# Patient Record
Sex: Female | Born: 1995 | Race: Black or African American | Hispanic: No | Marital: Single | Smoking: Never smoker
Health system: Southern US, Community
[De-identification: ages and names within clinical notes are randomized; demographics above are authoritative.]

---

## 2016-06-01 ENCOUNTER — Encounter (HOSPITAL_COMMUNITY): Payer: Self-pay | Admitting: *Deleted

## 2016-06-01 ENCOUNTER — Ambulatory Visit (HOSPITAL_COMMUNITY)
Admission: EM | Admit: 2016-06-01 | Discharge: 2016-06-01 | Disposition: A | Discharge status: Home / Self Care | Payer: BLUE CROSS/BLUE SHIELD | Attending: Family Medicine | Admitting: Family Medicine

## 2016-06-01 DIAGNOSIS — Z3201 Encounter for pregnancy test, result positive: Secondary | ICD-10-CM | POA: Diagnosis not present

## 2016-06-01 LAB — POCT PREGNANCY, URINE: Preg Test, Ur: POSITIVE — AB

## 2016-06-01 NOTE — ED Triage Notes (Signed)
Pt  Is  Late  On  Her  Period  She   States  Had  Positive  Pregnancy  Test     She  denys  Any  Symptoms

## 2016-06-01 NOTE — ED Provider Notes (Signed)
CSN: [id number]     Arrival date & time 06/01/16  1343 History   None    Chief Complaint  Patient presents with  . Possible Pregnancy   (Consider location/radiation/quality/duration/timing/severity/associated sxs/prior Treatment) HPI   20 y.o. Female G0P0 presents to UC c/o missed period which was to occur 5 days ago and 2 positive home pregnancy tests.  Reports intercourse at the beginning of this month. No form of bc. Reports slight nausea last week. Denies fever, abdominal pain, dysuria.  Reports good family support. Has never seen an OBGYN.   History reviewed. No pertinent past medical history. History reviewed. No pertinent surgical history. History reviewed. No pertinent family history. Social History  Substance Use Topics  . Smoking status: Never Smoker  . Smokeless tobacco: Never Used  . Alcohol use No   OB History    No data available     Review of Systems  Denies: HEADACHE, ABDOMINAL PAIN, CHEST PAIN, CONGESTION, DYSURIA, SHORTNESS OF BREATH   Allergies  Review of patient's allergies indicates no known allergies.  Home Medications   Prior to Admission medications   Not on File   Meds Ordered and Administered this Visit  Medications - No data to display  BP (!) 88/61 (BP Location: Right Arm)   Pulse 65   Temp 98.7 F (37.1 C) (Oral)   Resp 16   LMP 05/05/2016   SpO2 100%  No data found.   Physical Exam  Constitutional: She is oriented to person, place, and time. She appears well-developed and well-nourished.  HENT:  Head: Normocephalic and atraumatic.  Eyes: EOM are normal.  Neck: Normal range of motion.  Cardiovascular: Normal rate.   Pulmonary/Chest: Effort normal. No respiratory distress.  Abdominal: Soft.  Musculoskeletal: Normal range of motion.  Neurological: She is alert and oriented to person, place, and time.  Skin: Skin is warm and dry.  Psychiatric: She has a normal mood and affect. Her behavior is normal.  Nursing note and vitals  reviewed.   Urgent Care Course   Clinical Course    Procedures (including critical care time)  Labs Review Labs Reviewed  POCT PREGNANCY, URINE - Abnormal; Notable for the following:       Result Value   Preg Test, Ur POSITIVE (*)    All other components within normal limits    Imaging Review No results found.    MDM   1. Positive pregnancy test    20 y.o. Presents with c/o missed period and two at home positive pregnancy tests.   POC pregnancy urine: positive  Patient notified of results and referred to Dr. Mindi SlickerBanga for prenatal care. Advised to call office to make an appointment as soon as possible.  Advised to begin taking 0.4 mg folic acid daily, avoid all alcohol and tobacco products.  Discussed handout on prenatal care.     Tharon AquasFrank C Dannisha Eckmann, PA 06/01/16 1958

## 2016-06-10 ENCOUNTER — Emergency Department (HOSPITAL_COMMUNITY): Payer: BLUE CROSS/BLUE SHIELD

## 2016-06-10 ENCOUNTER — Encounter (HOSPITAL_COMMUNITY): Payer: Self-pay | Admitting: Emergency Medicine

## 2016-06-10 ENCOUNTER — Emergency Department (HOSPITAL_COMMUNITY)
Admission: EM | Admit: 2016-06-10 | Discharge: 2016-06-10 | Disposition: A | Discharge status: Home / Self Care | Payer: BLUE CROSS/BLUE SHIELD | Attending: Emergency Medicine | Admitting: Emergency Medicine

## 2016-06-10 DIAGNOSIS — O26891 Other specified pregnancy related conditions, first trimester: Secondary | ICD-10-CM | POA: Diagnosis present

## 2016-06-10 DIAGNOSIS — Z3A01 Less than 8 weeks gestation of pregnancy: Secondary | ICD-10-CM | POA: Insufficient documentation

## 2016-06-10 DIAGNOSIS — N898 Other specified noninflammatory disorders of vagina: Secondary | ICD-10-CM | POA: Insufficient documentation

## 2016-06-10 DIAGNOSIS — O99011 Anemia complicating pregnancy, first trimester: Secondary | ICD-10-CM | POA: Diagnosis not present

## 2016-06-10 DIAGNOSIS — D509 Iron deficiency anemia, unspecified: Secondary | ICD-10-CM

## 2016-06-10 DIAGNOSIS — O9989 Other specified diseases and conditions complicating pregnancy, childbirth and the puerperium: Secondary | ICD-10-CM | POA: Diagnosis not present

## 2016-06-10 DIAGNOSIS — R102 Pelvic and perineal pain: Secondary | ICD-10-CM

## 2016-06-10 LAB — COMPREHENSIVE METABOLIC PANEL
ALT: 9 U/L — ABNORMAL LOW (ref 14–54)
AST: 16 U/L (ref 15–41)
Albumin: 4.3 g/dL (ref 3.5–5.0)
Alkaline Phosphatase: 47 U/L (ref 38–126)
Anion gap: 7 (ref 5–15)
BUN: 9 mg/dL (ref 6–20)
CHLORIDE: 103 mmol/L (ref 101–111)
CO2: 24 mmol/L (ref 22–32)
Calcium: 9.4 mg/dL (ref 8.9–10.3)
Creatinine, Ser: 0.71 mg/dL (ref 0.44–1.00)
Glucose, Bld: 82 mg/dL (ref 65–99)
POTASSIUM: 3.6 mmol/L (ref 3.5–5.1)
Sodium: 134 mmol/L — ABNORMAL LOW (ref 135–145)
Total Bilirubin: 0.8 mg/dL (ref 0.3–1.2)
Total Protein: 7.3 g/dL (ref 6.5–8.1)

## 2016-06-10 LAB — URINALYSIS, ROUTINE W REFLEX MICROSCOPIC
Bilirubin Urine: NEGATIVE
GLUCOSE, UA: NEGATIVE mg/dL
Hgb urine dipstick: NEGATIVE
Ketones, ur: NEGATIVE mg/dL
LEUKOCYTES UA: NEGATIVE
NITRITE: NEGATIVE
PH: 7.5 (ref 5.0–8.0)
PROTEIN: NEGATIVE mg/dL
Specific Gravity, Urine: 1.012 (ref 1.005–1.030)

## 2016-06-10 LAB — WET PREP, GENITAL
Sperm: NONE SEEN
TRICH WET PREP: NONE SEEN
YEAST WET PREP: NONE SEEN

## 2016-06-10 LAB — CBC
HEMATOCRIT: 32.5 % — AB (ref 36.0–46.0)
Hemoglobin: 10.7 g/dL — ABNORMAL LOW (ref 12.0–15.0)
MCH: 25.1 pg — ABNORMAL LOW (ref 26.0–34.0)
MCHC: 32.9 g/dL (ref 30.0–36.0)
MCV: 76.1 fL — AB (ref 78.0–100.0)
PLATELETS: 182 10*3/uL (ref 150–400)
RBC: 4.27 MIL/uL (ref 3.87–5.11)
RDW: 17.8 % — ABNORMAL HIGH (ref 11.5–15.5)
WBC: 5.2 10*3/uL (ref 4.0–10.5)

## 2016-06-10 LAB — HCG, QUANTITATIVE, PREGNANCY: hCG, Beta Chain, Quant, S: 55334 m[IU]/mL — ABNORMAL HIGH (ref <5)

## 2016-06-10 LAB — LIPASE, BLOOD: LIPASE: 21 U/L (ref 11–51)

## 2016-06-10 MED ORDER — PRENATAL COMPLETE 14-0.4 MG PO TABS: 1.0000 | ORAL_TABLET | Freq: Every day | ORAL | 0 refills | Status: DC

## 2016-06-10 NOTE — Progress Notes (Signed)
Patient listed as having [employer] insurance without a pcp.  EDCM spoke to patient at bedside.  Patient confirms she does  Not have a pcp.  EDCM provided patient with a list of providers who accept [employer] insurance.  Encouraged patient to establish care as soon as possible.  No further EDCM needs at this time.

## 2016-06-10 NOTE — ED Triage Notes (Signed)
Patient complaining of lower abdominal pain x1 week. Denies nausea, vomiting, diarrhea, dysuria. Reports yellow/white vaginal discharge x1-2 days. Also reports intermittent headaches x1 week. Patient alert, oriented x4, ambulatory.

## 2016-06-10 NOTE — ED Provider Notes (Signed)
[facility]-EMERGENCY DEPT Provider Note   CSN: [id number] Arrival date & time: 06/10/16  1746     History   Chief Complaint Chief Complaint  Patient presents with  . Abdominal Pain  . Vaginal Discharge  . Headache    HPI Molly Rowe is a 20 y.o. female.  She complains of right suprapubic pain which started about one week ago. Pain waxes and wanes. She rates it at 6/10. It is usually a dull pressure, with occasional sharp pains. It is worse when she walks but notices no other aggravating or alleviating factors. She has not taken anything for pain. There is no dyspareunia. She denies urinary urgency, frequency, tenesmus, dysuria. She did have a mild vaginal discharge 2 days ago but that has resolved. She did have some vaginal bleeding following sexual intercourse several days ago but no ongoing bleeding. Last menses was sometime in September and she states she has irregular menses. She did go to urgent care about one week ago and did have a positive pregnancy test. She has not started prenatal care. She is gravida 1, paraso. During the same time frame, she has had an intermittent right frontal headache. Pain is sharp and will last for about 10 minutes before resolving. She is unable to put a number on the pain when present. She has noted no aggravating or alleviating factors. She denies nausea or vomiting and denies breast swelling or tenderness. Of note, she was advised to take folic acid when she was seen at urgent care but has not been doing so. She was also advised to make arrangements for prenatal care but has not done so.   The history is provided by the patient.  Abdominal Pain   Associated symptoms include headaches.  Vaginal Discharge   Associated symptoms include abdominal pain.  Headache      History reviewed. No pertinent past medical history.  There are no active problems to display for this patient.   History reviewed. No pertinent surgical history.  OB History    No  data available       Home Medications    Prior to Admission medications   Not on File    Family History No family history on file.  Social History Social History  Substance Use Topics  . Smoking status: Never Smoker  . Smokeless tobacco: Never Used  . Alcohol use No     Allergies   Review of patient's allergies indicates no known allergies.   Review of Systems Review of Systems  Gastrointestinal: Positive for abdominal pain.  Genitourinary: Positive for vaginal discharge.  Neurological: Positive for headaches.  All other systems reviewed and are negative.    Physical Exam Updated Vital Signs BP 129/65 (BP Location: Right Arm)   Pulse 90   Temp 98.6 F (37 C) (Oral)   Resp 16   Ht 5\' 1"  (1.549 m)   Wt 125 lb (56.7 kg)   LMP 05/05/2016   SpO2 100%   BMI 23.62 kg/m   Physical Exam  Nursing note and vitals reviewed.  20 year old female, resting comfortably and in no acute distress. Vital signs are Normal. Oxygen saturation is 100%, which is normal. Head is normocephalic and atraumatic. PERRLA, EOMI. Oropharynx is clear. There is mild tenderness of the right temporalis muscle which does reproduce her pain. There is no tenderness of the paracervical muscles. Neck is nontender and supple without adenopathy or JVD. Back is nontender and there is no CVA tenderness. Lungs are clear without rales,  wheezes, or rhonchi. Chest is nontender. Heart has regular rate and rhythm without murmur. Abdomen is soft, flat, with mild right suprapubic tenderness. There is no rebound or guarding. There are no masses or hepatosplenomegaly and peristalsis is normoactive. Pelvic: Normal external female genitalia. Cervix is closed. No vaginal discharge seen, no bleeding seen. Mild cervical friability is present. Fundus is retroverted and difficult to assess size. No cervical motion tenderness. No adnexal masses or tenderness. Extremities have no cyanosis or edema, full range of motion  is present. Skin is warm and dry without rash. Neurologic: Mental status is normal, cranial nerves are intact, there are no motor or sensory deficits.  ED Treatments / Results  Labs (all labs ordered are listed, but only abnormal results are displayed) Labs Reviewed  WET PREP, GENITAL - Abnormal; Notable for the following:       Result Value   Clue Cells Wet Prep HPF POC PRESENT (*)    WBC, Wet Prep HPF POC FEW (*)    All other components within normal limits  COMPREHENSIVE METABOLIC PANEL - Abnormal; Notable for the following:    Sodium 134 (*)    ALT 9 (*)    All other components within normal limits  CBC - Abnormal; Notable for the following:    Hemoglobin 10.7 (*)    HCT 32.5 (*)    MCV 76.1 (*)    MCH 25.1 (*)    RDW 17.8 (*)    All other components within normal limits  URINALYSIS, ROUTINE W REFLEX MICROSCOPIC (NOT AT [facility]) - Abnormal; Notable for the following:    APPearance CLOUDY (*)    All other components within normal limits  HCG, QUANTITATIVE, PREGNANCY - Abnormal; Notable for the following:    hCG, Beta Chain, Quant, S 55,334 (*)    All other components within normal limits  LIPASE, BLOOD  RPR  HIV ANTIBODY (ROUTINE TESTING)  GC/CHLAMYDIA PROBE AMP ([place]) NOT AT [facility]   Radiology US Ob Comp Less 14 Wks  Result Date: 06/10/2016 CLINICAL DATA:  Lower abdominal pain for a few days. Bleeding last week. EXAM: OBSTETRIC <14 WK [place] AND TRANSVAGINAL OB US TECHNIQUE: Both transabdominal and transvaginal ultrasound examinations were performed for complete evaluation of the gestation as well as the maternal uterus, adnexal regions, and pelvic cul-de-sac. Transvaginal technique was performed to assess early pregnancy. COMPARISON:  None. FINDINGS: Intrauterine gestational sac: Visible Yolk sac:  Visible Embryo:  Not visible Cardiac Activity: Not visible Heart Rate:   bpm MSD: 17  mm   6 w   4  d CRL:    mm    w    d                  [place] EDC: Subchorionic hemorrhage:  None  visualized. Maternal uterus/adnexae: Normal. No abnormal pelvic fluid collections. IMPRESSION: The gestational sac is irregular in morphology, and the endometrial decidual reaction is relatively weak. Findings are suspicious but not yet definitive for failed pregnancy. Recommend follow-up US in 10-14 days for definitive diagnosis. This recommendation follows SRU consensus guidelines: Diagnostic Criteria for Nonviable Pregnancy Early in the First Trimester. Malva Limes Med 2013; 130:8657-84. Electronically Signed   By: Ellery Plunk M.D.   On: 06/10/2016 22:36   US Ob Transvaginal  Result Date: 06/10/2016 CLINICAL DATA:  Lower abdominal pain for a few days. Bleeding last week. EXAM: OBSTETRIC <14 WK [place] AND TRANSVAGINAL OB US TECHNIQUE: Both transabdominal and transvaginal ultrasound examinations were performed for  complete evaluation of the gestation as well as the maternal uterus, adnexal regions, and pelvic cul-de-sac. Transvaginal technique was performed to assess early pregnancy. COMPARISON:  None. FINDINGS: Intrauterine gestational sac: Visible Yolk sac:  Visible Embryo:  Not visible Cardiac Activity: Not visible Heart Rate:   bpm MSD: 17  mm   6 w   4  d CRL:    mm    w    d                  US EDC: Subchorionic hemorrhage:  None visualized. Maternal uterus/adnexae: Normal. No abnormal pelvic fluid collections. IMPRESSION: The gestational sac is irregular in morphology, and the endometrial decidual reaction is relatively weak. Findings are suspicious but not yet definitive for failed pregnancy. Recommend follow-up US in 10-14 days for definitive diagnosis. This recommendation follows SRU consensus guidelines: Diagnostic Criteria for Nonviable Pregnancy Early in the First Trimester. Malva Limes Engl J Med 2013; 829:5621-30; 369:1443-51. Electronically Signed   By: Ellery Plunkaniel R Mitchell M.D.   On: 06/10/2016 22:36    Procedures Procedures (including critical care time)  Medications Ordered in ED Medications - No data to  display   Initial Impression / Assessment and Plan / ED Course  I have reviewed the triage vital signs and the nursing notes.  Pertinent labs & imaging results that were available during my care of the patient were reviewed by me and considered in my medical decision making (see chart for details).  Clinical Course   Pelvic pain in patient in first trimester pregnancy with one episode of vaginal bleeding. This certainly could be related to a corpus luteum cyst, possible round ligament pain. Because of vaginal bleeding, she will be sent for pelvic ultrasound. Old records are reviewed confirming urgent care visit with positive pregnancy test on October 23.  HCG is come back over 55,000. Wet prep did show clue cells, which he does not clinically have bacterial vaginosis, so no treatment is initiated. Ultrasound is worrisome for failed pregnancy. Everything this to the patient. She is referred to women's clinic for follow-up.  Final Clinical Impressions(s) / ED Diagnoses   Final diagnoses:  Pelvic pain during pregnancy in first trimester, antepartum  Microcytic anemia    New Prescriptions Discharge Medication List as of 06/10/2016 11:17 PM    START taking these medications   Details  Prenatal Vit-Fe Fumarate-FA (PRENATAL COMPLETE) 14-0.4 MG TABS Take 1 tablet by mouth daily., Starting Wed 06/10/2016, Print         Dione Boozeavid Briannah Lona, MD 06/11/16 [phone]

## 2016-06-11 ENCOUNTER — Telehealth: Payer: Self-pay | Admitting: *Deleted

## 2016-06-11 DIAGNOSIS — Z3490 Encounter for supervision of normal pregnancy, unspecified, unspecified trimester: Secondary | ICD-10-CM

## 2016-06-11 LAB — GC/CHLAMYDIA PROBE AMP (~~LOC~~) NOT AT ARMC
Chlamydia: NEGATIVE
NEISSERIA GONORRHEA: NEGATIVE

## 2016-06-11 LAB — RPR: RPR Ser Ql: NONREACTIVE

## 2016-06-11 LAB — HIV ANTIBODY (ROUTINE TESTING W REFLEX): HIV Screen 4th Generation wRfx: NONREACTIVE

## 2016-06-11 NOTE — Telephone Encounter (Signed)
Repeat US scheduled for patient.

## 2016-06-30 ENCOUNTER — Encounter: Payer: Self-pay | Admitting: Family Medicine

## 2016-06-30 ENCOUNTER — Ambulatory Visit: Payer: BLUE CROSS/BLUE SHIELD | Admitting: *Deleted

## 2016-06-30 ENCOUNTER — Ambulatory Visit (HOSPITAL_COMMUNITY)
Admission: RE | Admit: 2016-06-30 | Discharge: 2016-06-30 | Disposition: A | Discharge status: Home / Self Care | Payer: BLUE CROSS/BLUE SHIELD | Source: Ambulatory Visit | Attending: Obstetrics & Gynecology | Admitting: Obstetrics & Gynecology

## 2016-06-30 DIAGNOSIS — Z3689 Encounter for other specified antenatal screening: Secondary | ICD-10-CM | POA: Insufficient documentation

## 2016-06-30 DIAGNOSIS — Z3490 Encounter for supervision of normal pregnancy, unspecified, unspecified trimester: Secondary | ICD-10-CM

## 2016-06-30 DIAGNOSIS — O208 Other hemorrhage in early pregnancy: Secondary | ICD-10-CM | POA: Diagnosis not present

## 2016-06-30 DIAGNOSIS — Z3A08 8 weeks gestation of pregnancy: Secondary | ICD-10-CM | POA: Diagnosis not present

## 2016-06-30 NOTE — Progress Notes (Signed)
EDD  02/08/17 per US today.  Pt informed of viable pregnancy and need to begin prenatal care.  She voiced understanding.

## 2017-10-09 IMAGING — US US OB TRANSVAGINAL
1 series · 15 of 28 positions shown · non-contrast
Comparison: None.

CLINICAL DATA: Early pregnancy

EXAM:
TRANSVAGINAL OB ULTRASOUND
TECHNIQUE: Transvaginal ultrasound was performed for complete evaluation of the
gestation as well as the maternal uterus, adnexal regions, and
pelvic cul-de-sac.

[Series 1: us ob transvaginal · 39 acquisitions, 15 frames shown]
[im 1/39]
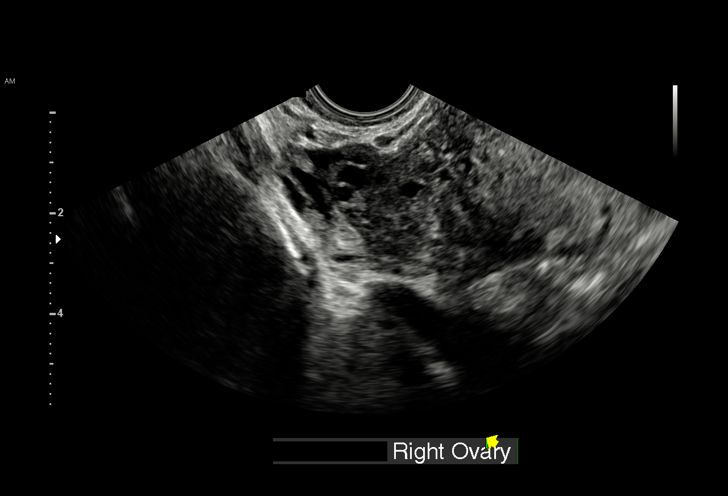
[im 3/39]
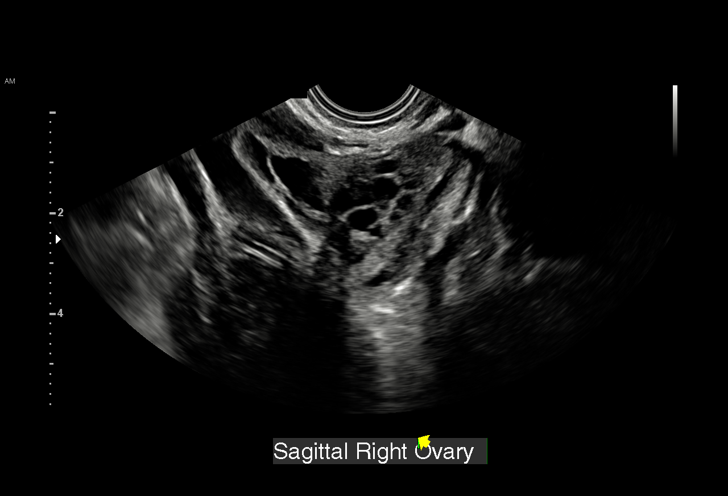
[im 6/39]
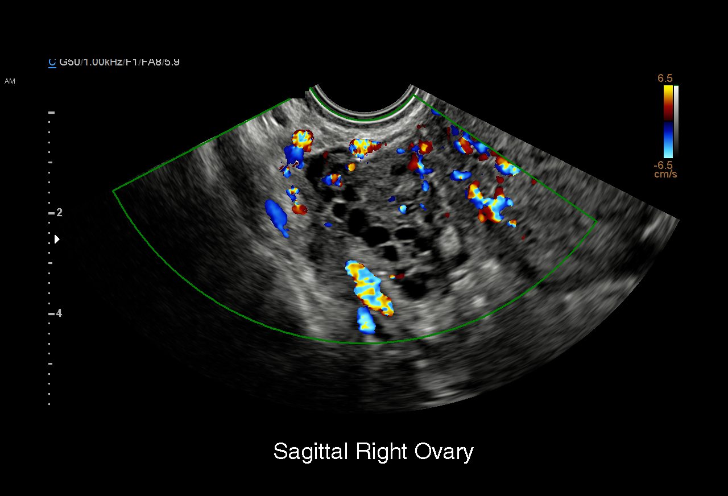
[im 9/39]
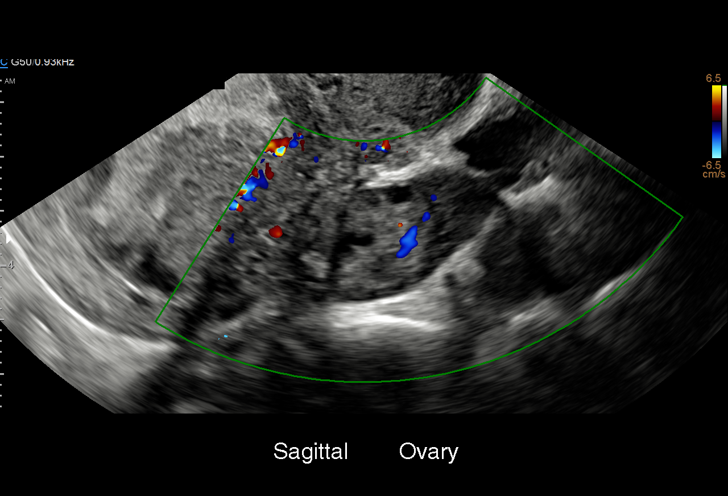
[im 12/39]
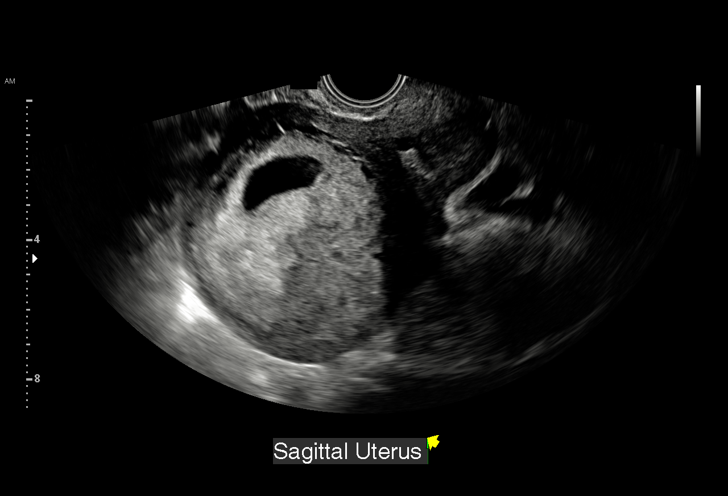
[im 15/39]
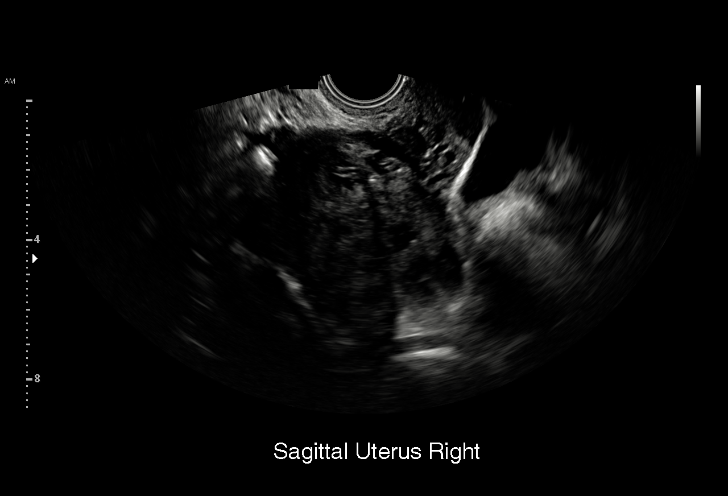
[im 17/39]
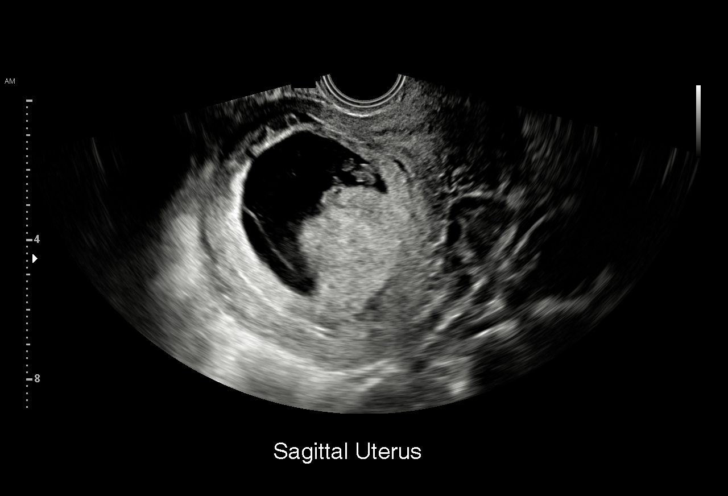
[im 20/39]
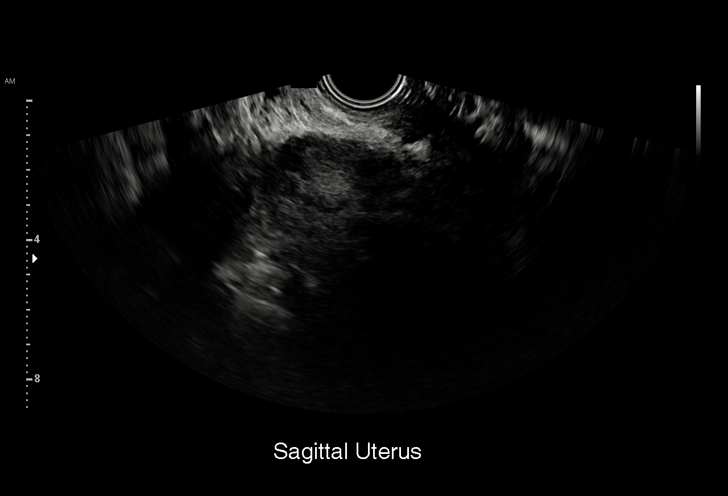
[im 22/39]
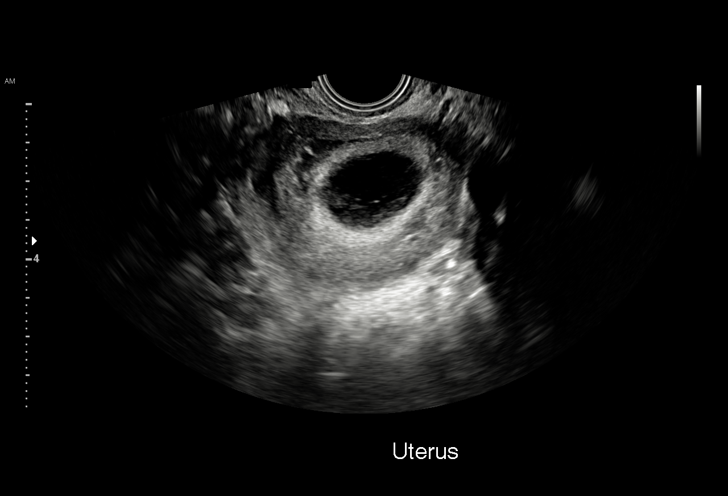
[im 24/39]
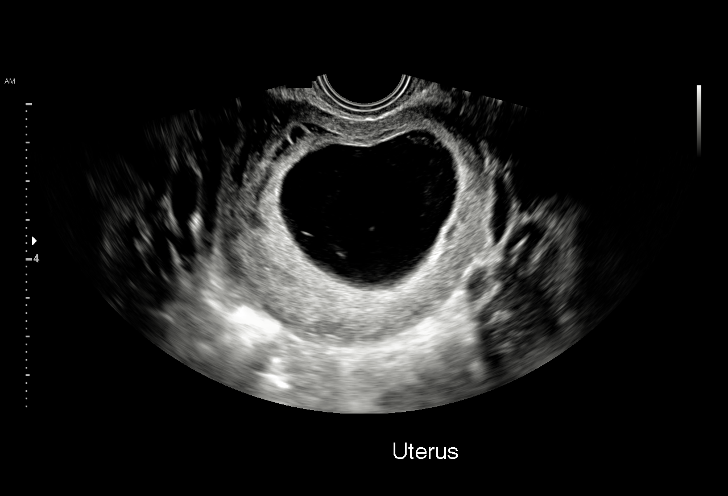
[im 27/39]
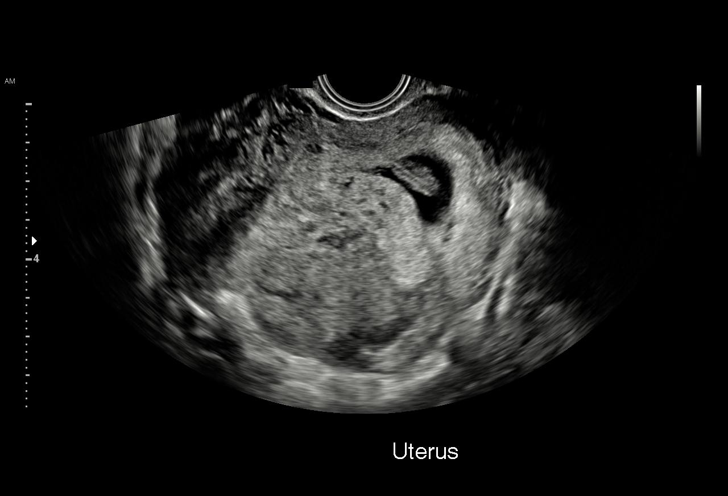
[im 30/39]
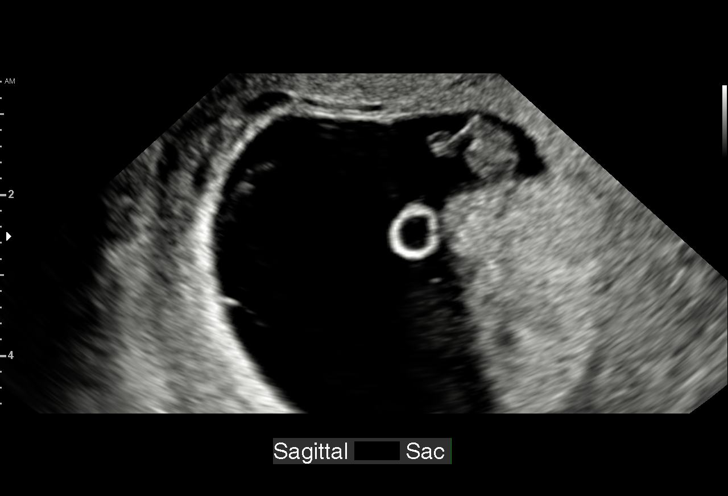
[im 33/39]
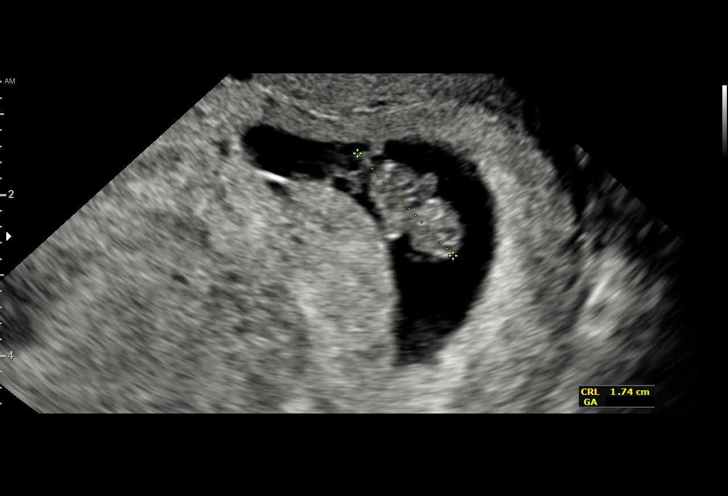
[im 36/39]
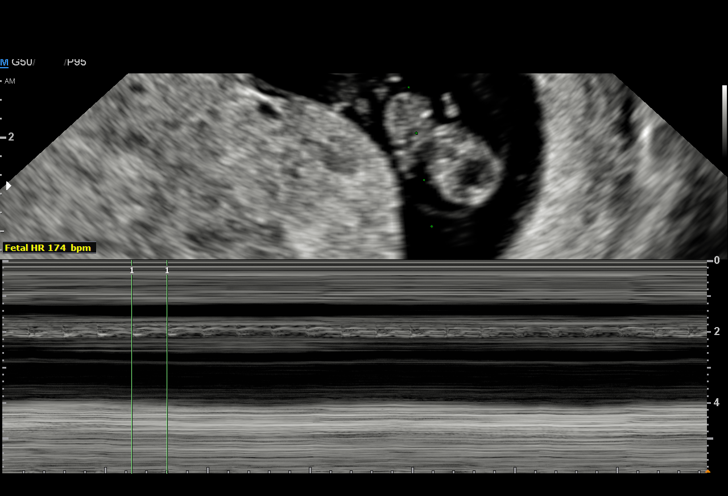
[im 39/39]
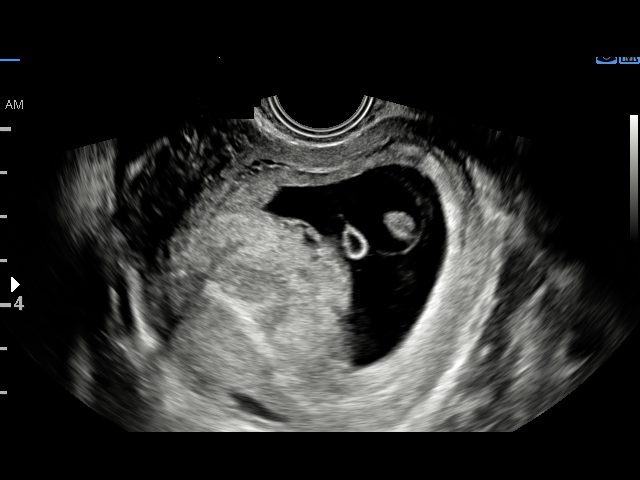

[15 of 28 positions shown; findings below may reference images not displayed]

FINDINGS: Intrauterine gestational sac: Single

Yolk sac:  Visualized

Embryo:  Visualize

Cardiac Activity: Visualized

CRL:   17  mm   8 w 1 d                  US EDC: 02/08/2017

Maternal uterus/adnexae:

Subchorionic hemorrhage: Small

Right ovary: Normal

Left ovary: Normal

Other :None

Free fluid:  None
IMPRESSION: 1. Single living intrauterine gestation. Estimated gestational age
is 8 weeks and 1 day.
2. Small subchorionic hemorrhage.

## 2018-10-01 IMAGING — US US OB TRANSVAGINAL
1 series · 14 of 28 positions shown · non-contrast
Comparison: None.

CLINICAL DATA: Lower abdominal pain for a few days. Bleeding last
week.

EXAM:
OBSTETRIC <14 WK US AND TRANSVAGINAL OB US
TECHNIQUE: Both transabdominal and transvaginal ultrasound examinations were
performed for complete evaluation of the gestation as well as the
maternal uterus, adnexal regions, and pelvic cul-de-sac.
Transvaginal technique was performed to assess early pregnancy.

[Series 1: us ob transvaginal · 0.11mm/px · 46 acquisitions, 14 frames shown]
[im 2/46]
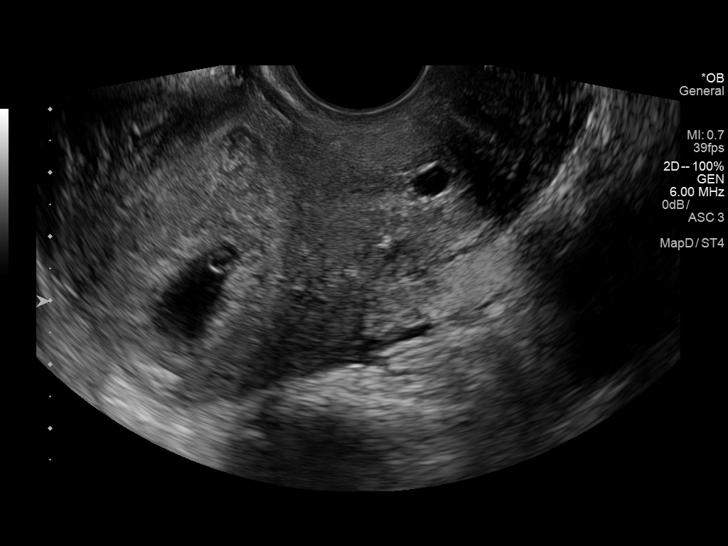
[im 6/46]
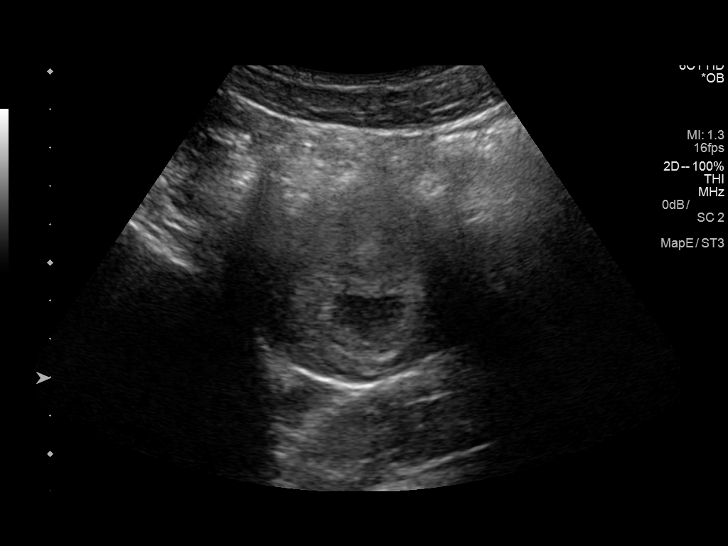
[im 9/46]
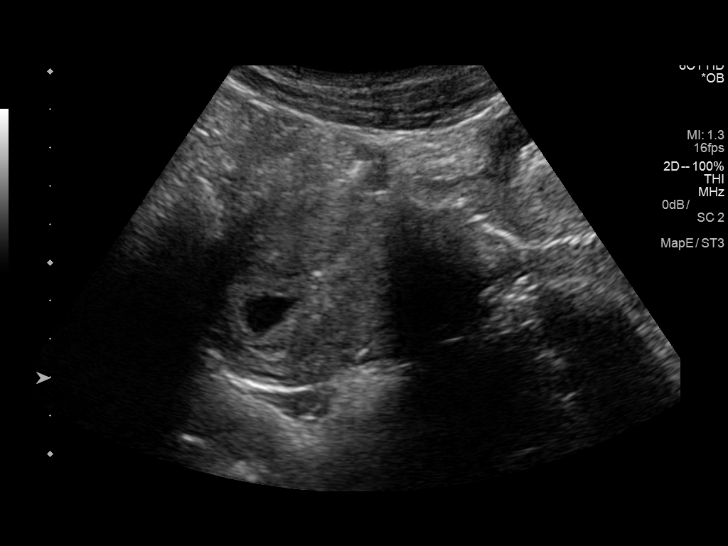
[im 12/46]
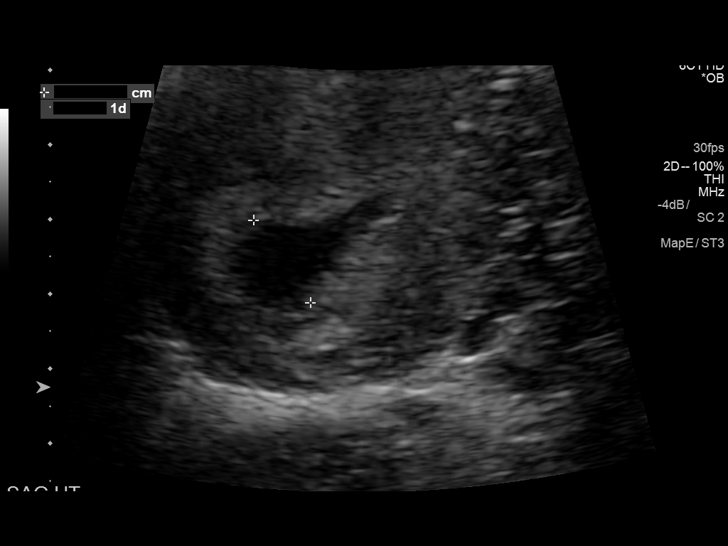
[im 16/46]
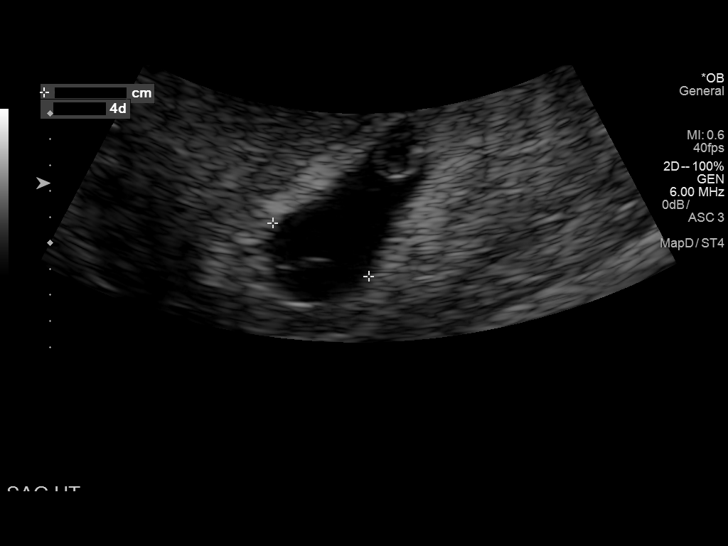
[im 19/46]
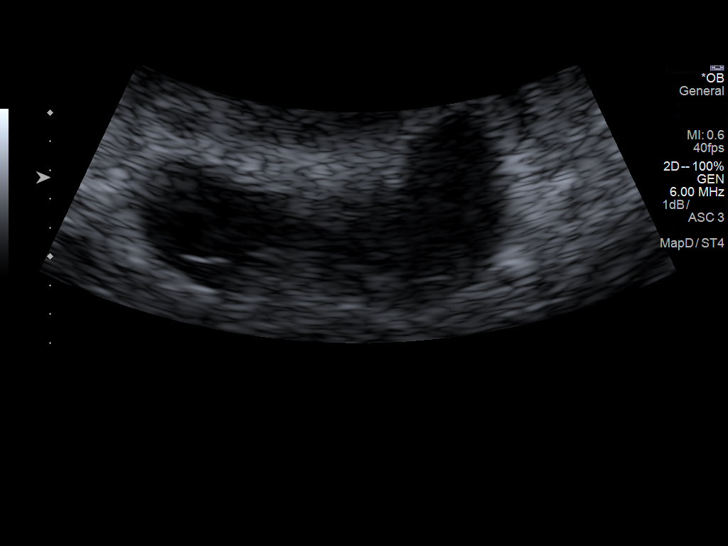
[im 22/46]
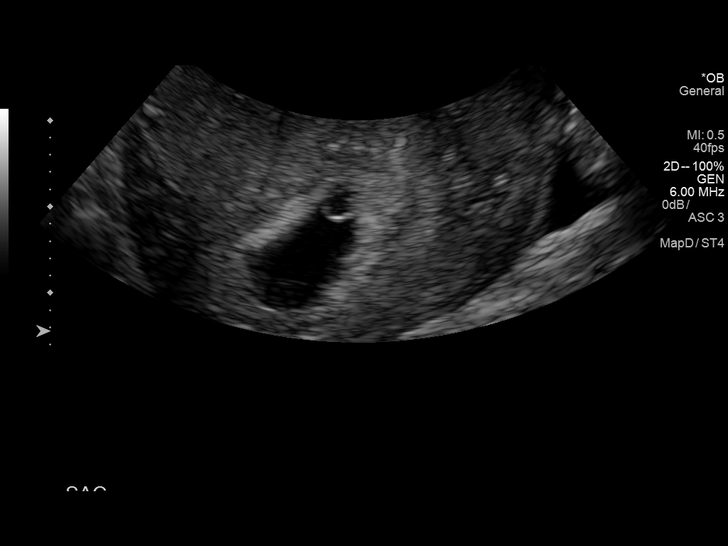
[im 26/46]
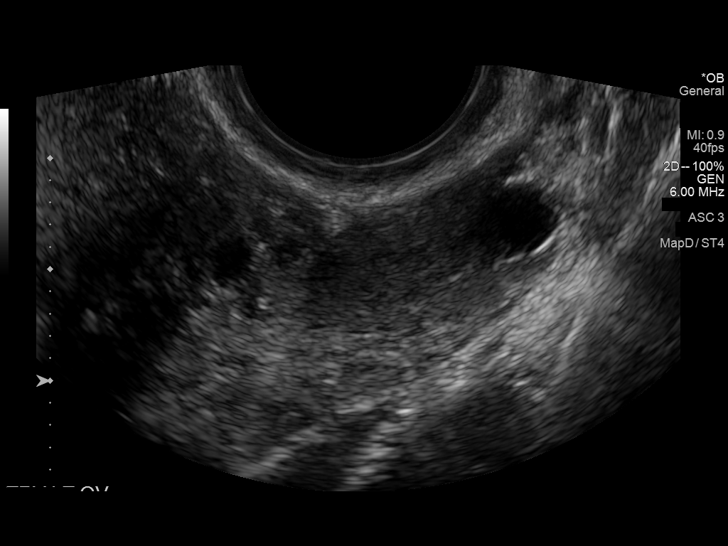
[im 29/46]
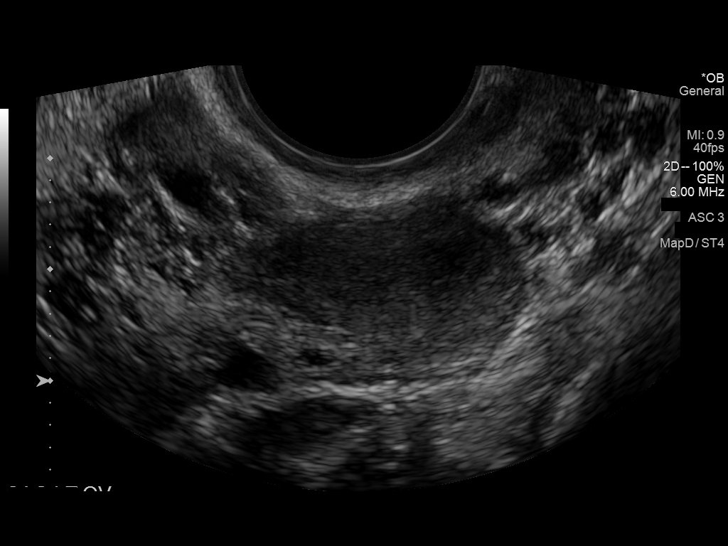
[im 32/46]
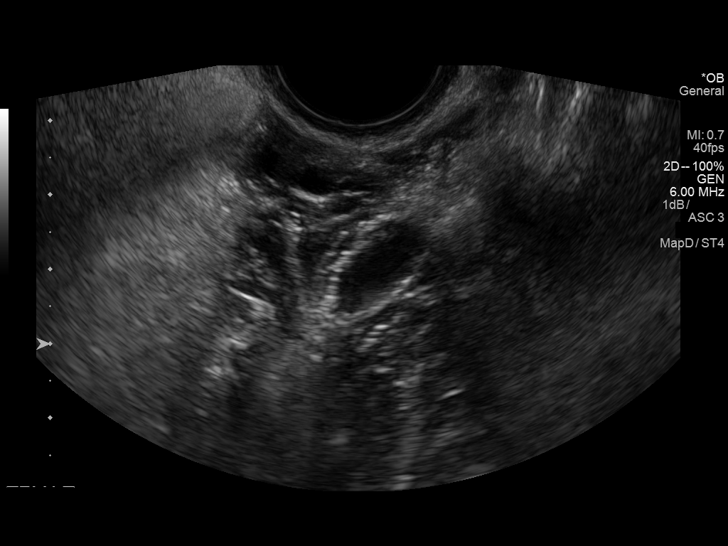
[im 36/46]
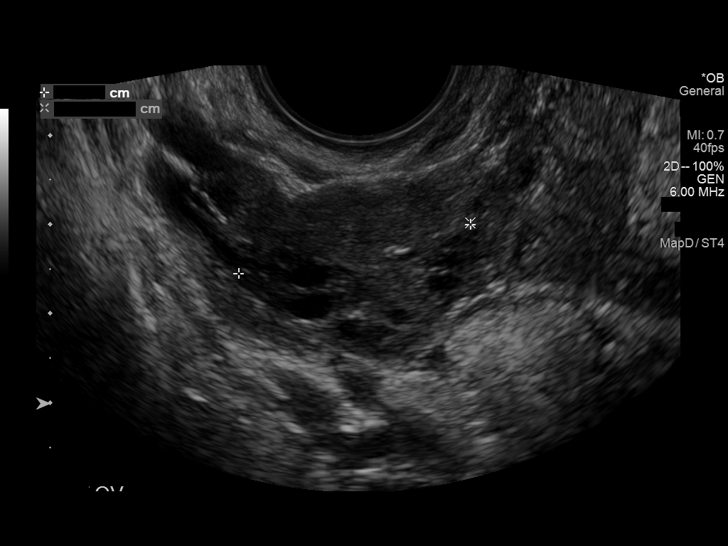
[im 39/46]
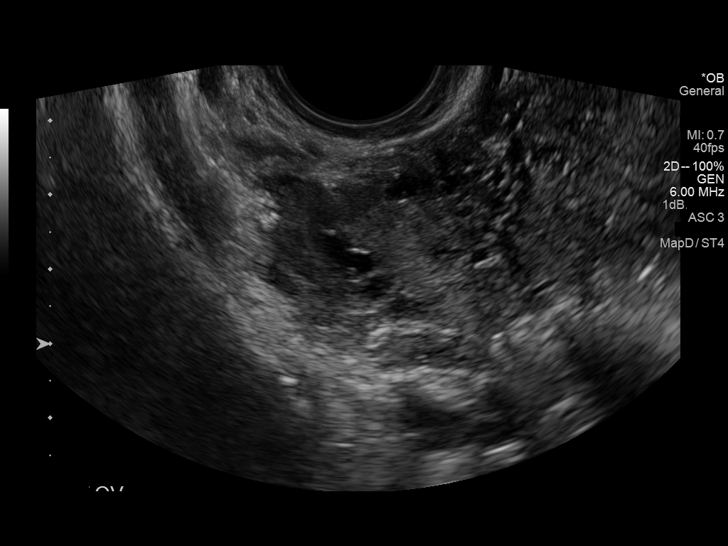
[im 42/46]
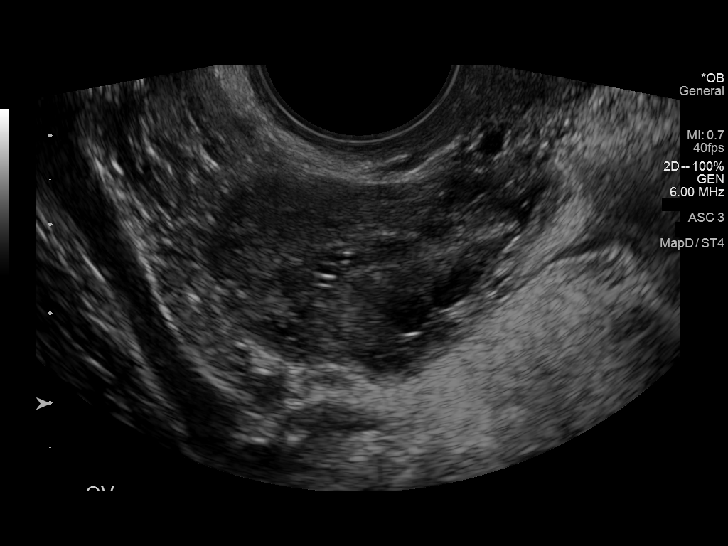
[im 46/46]
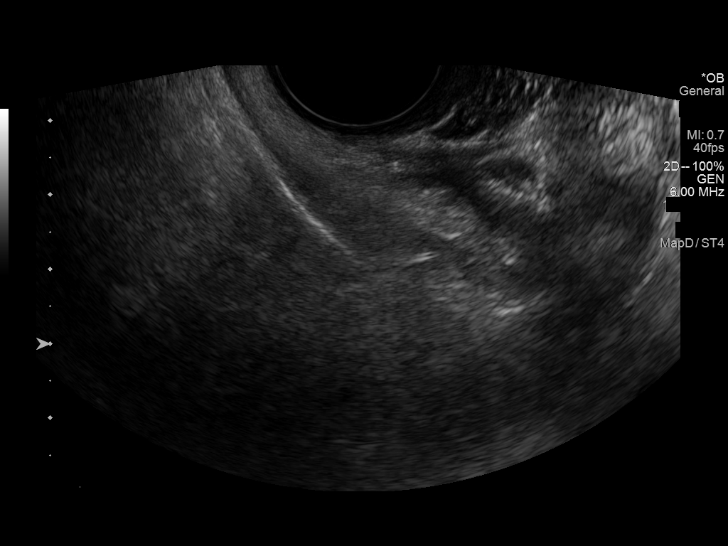

[14 of 28 positions shown; findings below may reference images not displayed]

FINDINGS: Intrauterine gestational sac: Visible

Yolk sac:  Visible

Embryo:  Not visible

Cardiac Activity: Not visible

Heart Rate:   bpm

MSD: 17  mm   6 w   4  d

CRL:    mm    w    d                  US EDC:

Subchorionic hemorrhage:  None visualized.

Maternal uterus/adnexae: Normal. No abnormal pelvic fluid
collections.
IMPRESSION: The gestational sac is irregular in morphology, and the endometrial
decidual reaction is relatively weak. Findings are suspicious but
not yet definitive for failed pregnancy. Recommend follow-up US in
10-14 days for definitive diagnosis. This recommendation follows SRU
consensus guidelines: Diagnostic Criteria for Nonviable Pregnancy
Early in the First Trimester. N Engl J Med 5978; [DATE].

## 2020-09-04 ENCOUNTER — Inpatient Hospital Stay (HOSPITAL_COMMUNITY)
Admission: EM | Admit: 2020-09-04 | Discharge: 2020-09-04 | Disposition: A | Discharge status: Home / Self Care | Payer: BC Managed Care – PPO | Attending: Obstetrics & Gynecology | Admitting: Obstetrics & Gynecology

## 2020-09-04 ENCOUNTER — Other Ambulatory Visit: Payer: Self-pay

## 2020-09-04 ENCOUNTER — Encounter (HOSPITAL_COMMUNITY): Payer: Self-pay | Admitting: Emergency Medicine

## 2020-09-04 ENCOUNTER — Inpatient Hospital Stay (HOSPITAL_COMMUNITY): Payer: BC Managed Care – PPO

## 2020-09-04 DIAGNOSIS — Z3A01 Less than 8 weeks gestation of pregnancy: Secondary | ICD-10-CM | POA: Diagnosis not present

## 2020-09-04 DIAGNOSIS — O4692 Antepartum hemorrhage, unspecified, second trimester: Secondary | ICD-10-CM

## 2020-09-04 DIAGNOSIS — R109 Unspecified abdominal pain: Secondary | ICD-10-CM

## 2020-09-04 DIAGNOSIS — O26899 Other specified pregnancy related conditions, unspecified trimester: Secondary | ICD-10-CM

## 2020-09-04 DIAGNOSIS — M62831 Muscle spasm of calf: Secondary | ICD-10-CM | POA: Diagnosis not present

## 2020-09-04 DIAGNOSIS — R1031 Right lower quadrant pain: Secondary | ICD-10-CM | POA: Diagnosis not present

## 2020-09-04 DIAGNOSIS — O209 Hemorrhage in early pregnancy, unspecified: Secondary | ICD-10-CM | POA: Insufficient documentation

## 2020-09-04 DIAGNOSIS — R110 Nausea: Secondary | ICD-10-CM | POA: Diagnosis not present

## 2020-09-04 DIAGNOSIS — M549 Dorsalgia, unspecified: Secondary | ICD-10-CM | POA: Diagnosis not present

## 2020-09-04 DIAGNOSIS — Z3491 Encounter for supervision of normal pregnancy, unspecified, first trimester: Secondary | ICD-10-CM

## 2020-09-04 DIAGNOSIS — R519 Headache, unspecified: Secondary | ICD-10-CM | POA: Insufficient documentation

## 2020-09-04 DIAGNOSIS — O26891 Other specified pregnancy related conditions, first trimester: Secondary | ICD-10-CM

## 2020-09-04 LAB — COMPREHENSIVE METABOLIC PANEL
ALT: 12 U/L (ref 0–44)
AST: 17 U/L (ref 15–41)
Albumin: 3.9 g/dL (ref 3.5–5.0)
Alkaline Phosphatase: 47 U/L (ref 38–126)
Anion gap: 10 (ref 5–15)
BUN: 7 mg/dL (ref 6–20)
CO2: 24 mmol/L (ref 22–32)
Calcium: 8.8 mg/dL — ABNORMAL LOW (ref 8.9–10.3)
Chloride: 102 mmol/L (ref 98–111)
Creatinine, Ser: 0.71 mg/dL (ref 0.44–1.00)
GFR, Estimated: >60 mL/min (ref >60)
Glucose, Bld: 90 mg/dL (ref 70–99)
Potassium: 3.4 mmol/L — ABNORMAL LOW (ref 3.5–5.1)
Sodium: 136 mmol/L (ref 135–145)
Total Bilirubin: 0.7 mg/dL (ref 0.3–1.2)
Total Protein: 7.1 g/dL (ref 6.5–8.1)

## 2020-09-04 LAB — CBC
HCT: 39.2 % (ref 36.0–46.0)
Hemoglobin: 13.7 g/dL (ref 12.0–15.0)
MCH: 31 pg (ref 26.0–34.0)
MCHC: 34.9 g/dL (ref 30.0–36.0)
MCV: 88.7 fL (ref 80.0–100.0)
Platelets: 157 10*3/uL (ref 150–400)
RBC: 4.42 MIL/uL (ref 3.87–5.11)
RDW: 13.3 % (ref 11.5–15.5)
WBC: 3.4 10*3/uL — ABNORMAL LOW (ref 4.0–10.5)
nRBC: 0 % (ref 0.0–0.2)

## 2020-09-04 LAB — I-STAT BETA HCG BLOOD, ED (MC, WL, AP ONLY): I-stat hCG, quantitative: >2000 m[IU]/mL — ABNORMAL HIGH (ref <5)

## 2020-09-04 LAB — WET PREP, GENITAL
Sperm: NONE SEEN
Trich, Wet Prep: NONE SEEN
Yeast Wet Prep HPF POC: NONE SEEN

## 2020-09-04 LAB — HCG, QUANTITATIVE, PREGNANCY: hCG, Beta Chain, Quant, S: 30617 m[IU]/mL — ABNORMAL HIGH (ref <5)

## 2020-09-04 LAB — LIPASE, BLOOD: Lipase: 20 U/L (ref 11–51)

## 2020-09-04 MED ORDER — RHO D IMMUNE GLOBULIN 1500 UNIT/2ML IJ SOSY
300.0000 ug | PREFILLED_SYRINGE | Freq: Once | INTRAMUSCULAR | Status: AC
  Administered 2020-09-04: 300 ug via INTRAMUSCULAR
  Filled 2020-09-04: qty 2

## 2020-09-04 MED ORDER — PRENATAL COMPLETE 14-0.4 MG PO TABS: 1.0000 | ORAL_TABLET | Freq: Every day | ORAL | 5 refills | Status: AC

## 2020-09-04 NOTE — ED Triage Notes (Signed)
Patient complains of lower abdominal pain x1 week. States the pain feels like a charlie horse her lower abdomen. Also reports constipation.

## 2020-09-04 NOTE — Discharge Instructions (Signed)
Prenatal Care Providers           [facility]  [address] [phone]  [facility]   [address]  [phone]  [facility] @ [place]       [address] [phone]            [facility]     [address] [phone]          [facility] @ [place]   [address] [phone]  [facility]  [address] [phone]     [facility]  [address]   [phone]     [facility]  Phone: [phone]  [facility]  Phone: [phone]  [facility] Phone: [phone]  [facility] Phone: [phone]  [facility] Phone: [phone]  [facility] Phone: [phone]  [facility]  Phone: [phone]  

## 2020-09-04 NOTE — MAU Note (Signed)
Patient states she came in for right lower stomach pains then started bleeding through her pants while in the ED waiting room.  LMP in early December.  Reports the pain comes and goes.  Denies any clots.

## 2020-09-04 NOTE — ED Triage Notes (Signed)
Emergency Medicine Provider OB Triage Evaluation Note  Molly Rowe is a 25 y.o. female, G2P1 Unknown gestation who presents to the emergency department with complaints of lower abdominal pain.  She describes this feels like a charlie horse over the last week.  It is in right slightly more than left but both sided.  She denies dysuria, frequency or urgency.  No vaginal bleeding.  LMP was in December, however she notes her cycles are irregular.    She did not know she was pregnant before she came here today  Review of  Systems  Positive: Lower abdominal pain Negative: dysuria, frequency, urgency  Physical Exam  BP 116/60 (BP Location: Right Arm)   Pulse 76   Temp 99 F (37.2 C) (Oral)   Resp 16   Ht 5\' 1"  (1.549 m)   Wt 52.2 kg   LMP  (Within Weeks)   SpO2 100%   BMI 21.73 kg/m  General: Awake, no distress  HEENT: Atraumatic  Resp: Normal effort  Cardiac: Normal rate Abd: Nondistended, nontender  MSK: Moves all extremities without difficulty Neuro: Speech clear  Medical Decision Making  Pt evaluated for pregnancy concern and is stable for transfer to MAU. Pt is in agreement with plan for transfer.  3:03 PM Discussed with MAU APP, , who accepts patient in transfer.  Clinical Impression   1. Abdominal pain during pregnancy, antepartum        Dorothe Pea, Cristina Gong 09/04/20 1503

## 2020-09-04 NOTE — MAU Provider Note (Addendum)
Chief Complaint:  Abdominal Pain   Event Date/Time   First Provider Initiated Contact with Patient 09/04/20 1607     HPI: Molly Rowe is a 25 y.o. G2P0101 at [redacted]w[redacted]d who presents to maternity admissions reporting right lower quadrant pain and vaginal bleeding. RLQ pain is intermittent and started last week. Described as cramping "charley horse" type of pain. No aggravating or alleviating factors. Pain radiates to mid-lower abdomen. She also complain of headache, nausea and back pain for the past few weeks. Denies vomiting vaginal discharge, urinary symptoms, fever, chills, vaginal pain, anorexia or recent illness. Exact LMP unknown, patient states beginning to mid December.    Location: RLQ Quality: Cramping Severity: 8/10 in pain scale with episodes Duration: one week Timing: intermittent episodes Modifying factors: none Associated signs and symptoms: nausea  Pregnancy Course: Patient was unaware she was pregnant until today's visit.   History reviewed. No pertinent past medical history. OB History  Gravida Para Term Preterm AB Living  2 1   1   1   SAB IAB Ectopic Multiple Live Births          1    # Outcome Date GA Lbr Len/2nd Weight Sex Delivery Anes PTL Lv  2 Current           1 Preterm 01/05/17 [redacted]w[redacted]d   F Vag-Spont  Y LIV   History reviewed. No pertinent surgical history. No family history on file. Social History   Tobacco Use  . Smoking status: Never Smoker  . Smokeless tobacco: Never Used  Substance Use Topics  . Alcohol use: No   No Known Allergies No medications prior to admission.    I have reviewed patient's Past Medical Hx, Surgical Hx, Family Hx, Social Hx, medications and allergies.   ROS:  Review of Systems  Physical Exam   Patient Vitals for the past 24 hrs:  BP Temp Temp src Pulse Resp SpO2 Height Weight  09/04/20 1934 100/69 -- -- 69 18 100 % -- --  09/04/20 1539 118/63 98.4 F (36.9 C) -- 64 17 -- -- --  09/04/20 1309 116/60 -- -- -- -- -- -- --   09/04/20 1304 123/68 99 F (37.2 C) Oral 76 16 100 % 5\' 1"  (1.549 m) 52.2 kg    Constitutional: Well-developed, well-nourished female in no acute distress.  Cardiovascular: normal rate & rhythm, no murmur Respiratory: normal effort, lung sounds clear throughout GI: Abd soft, non-tender, gravid appropriate for gestational age. Pos BS x 4 MS: Extremities nontender, no edema, normal ROM Neurologic: Alert and oriented x 4.  GU: no CVA tenderness Pelvic exam: Cervix pink, visually closed, without lesion, dark red blood noted around cervix, one fox swab used to visualize cervix, vaginal walls and external genitalia normal    Exam performed by: 09/06/20, PA-S with supervision by    Labs: Results for orders placed or performed during the hospital encounter of 09/04/20 (from the past 24 hour(s))  Lipase, blood     Status: None   Collection Time: 09/04/20  1:11 PM  Result Value Ref Range   Lipase 20 11 - 51 U/L  Comprehensive metabolic panel     Status: Abnormal   Collection Time: 09/04/20  1:11 PM  Result Value Ref Range   Sodium 136 135 - 145 mmol/L   Potassium 3.4 (L) 3.5 - 5.1 mmol/L   Chloride 102 98 - 111 mmol/L   CO2 24 22 - 32 mmol/L   Glucose, Bld 90 70 - 99  mg/dL   BUN 7 6 - 20 mg/dL   Creatinine, Ser 9.98 0.44 - 1.00 mg/dL   Calcium 8.8 (L) 8.9 - 10.3 mg/dL   Total Protein 7.1 6.5 - 8.1 g/dL   Albumin 3.9 3.5 - 5.0 g/dL   AST 17 15 - 41 U/L   ALT 12 0 - 44 U/L   Alkaline Phosphatase 47 38 - 126 U/L   Total Bilirubin 0.7 0.3 - 1.2 mg/dL   GFR, Estimated >33 >82 mL/min   Anion gap 10 5 - 15  CBC     Status: Abnormal   Collection Time: 09/04/20  1:11 PM  Result Value Ref Range   WBC 3.4 (L) 4.0 - 10.5 K/uL   RBC 4.42 3.87 - 5.11 MIL/uL   Hemoglobin 13.7 12.0 - 15.0 g/dL   HCT 50.5 39.7 - 67.3 %   MCV 88.7 80.0 - 100.0 fL   MCH 31.0 26.0 - 34.0 pg   MCHC 34.9 30.0 - 36.0 g/dL   RDW 41.9 37.9 - 02.4 %   Platelets 157 150 - 400 K/uL   nRBC 0.0  0.0 - 0.2 %  ABO/Rh     Status: None   Collection Time: 09/04/20  1:11 PM  Result Value Ref Range   ABO/RH(D)      O NEG Performed at [facility], [address], [place], [place] [address]   Rh IG workup (includes ABO/Rh)     Status: None (Preliminary result)   Collection Time: 09/04/20  1:11 PM  Result Value Ref Range   Gestational Age(Wks) 5    ABO/RH(D) O NEG    Antibody Screen NEG    Unit Number [id number]    Blood Component Type RHIG    Unit division 00    Status of Unit ISSUED    Transfusion Status      OK TO TRANSFUSE Performed at [facility], [address], [place], [place] [address]   I-Stat beta hCG blood, ED     Status: Abnormal   Collection Time: 09/04/20  1:48 PM  Result Value Ref Range   I-stat hCG, quantitative >2,000.0 (H) <5 mIU/mL   Comment 3          hCG, quantitative, pregnancy     Status: Abnormal   Collection Time: 09/04/20  4:25 PM  Result Value Ref Range   hCG, Beta Chain, Quant, S 30,617 (H) <5 mIU/mL  Wet prep, genital     Status: Abnormal   Collection Time: 09/04/20  5:07 PM   Specimen: Cervix; Genital  Result Value Ref Range   Yeast Wet Prep HPF POC NONE SEEN NONE SEEN   Trich, Wet Prep NONE SEEN NONE SEEN   Clue Cells Wet Prep HPF POC PRESENT (A) NONE SEEN   WBC, Wet Prep HPF POC MODERATE (A) NONE SEEN   Sperm NONE SEEN     Imaging:  US OB LESS THAN 14 WEEKS WITH OB TRANSVAGINAL  Result Date: 09/04/2020 CLINICAL DATA:  Right lower abdominal pain EXAM: OBSTETRIC <14 WK [place] AND TRANSVAGINAL OB US TECHNIQUE: Both transabdominal and transvaginal ultrasound examinations were performed for complete evaluation of the gestation as well as the maternal uterus, adnexal regions, and pelvic cul-de-sac. Transvaginal technique was performed to assess early pregnancy. COMPARISON:  None. FINDINGS: Intrauterine gestational sac: Single Yolk sac:  Visualized. Embryo:  Visualized. Cardiac Activity: Visualized. Heart Rate: 75 bpm MSD:   mm    w      d CRL:  1.9 mm too small to date [place] EDC: Subchorionic hemorrhage:  None visualized. Maternal uterus/adnexae: No adnexal mass or free fluid. IMPRESSION: Early intrauterine pregnancy with 1.9 mm embryo. This is too small to date. Fetal bradycardia of study 5 beats per minute, likely related to early gestational age. This could be followed with repeat ultrasound in 10-14 days to ensure expected progression. Electronically Signed   By: Charlett Nose M.D.   On: 09/04/2020 18:09    MAU Course: Orders Placed This Encounter  Procedures  . Wet prep, genital  . US OB LESS THAN 14 WEEKS WITH OB TRANSVAGINAL  . US OB Transvaginal  . Lipase, blood  . Comprehensive metabolic panel  . CBC  . Urinalysis, Routine w reflex microscopic  . hCG, quantitative, pregnancy  . Diet NPO time specified  . I-Stat beta hCG blood, ED  . ABO/Rh  . Rh IG workup (includes ABO/Rh)  . Discharge patient   Meds ordered this encounter  Medications  . Prenatal Vit-Fe Fumarate-FA (PRENATAL COMPLETE) 14-0.4 MG TABS    Sig: Take 1 tablet by mouth daily.    Dispense:  30 tablet    Refill:  5    Order Specific Question:   Supervising Provider    Answer:   Jaynie Collins A [[id number]]  . rho (d) immune globulin (RHIG/RHOPHYLAC) injection 300 mcg    MDM: CMP and lipase ordered in ED prior to transfer to MAU were benign. Workup initiated to rule out ectopic pregnancy. CBC within normal limits.  TVUS revealed intrauterine pregnancy too early to date with fetal bradycardia of 75 bpm. Wet prep revealed moderate WBC's and clue cells, patient does not meet other criteria for BV and is asymptomatic.  Assessment: 1. Abdominal pain during pregnancy, antepartum   2. Vaginal bleeding in pregnancy, first trimester   3. Normal IUP (intrauterine pregnancy) on prenatal ultrasound, first trimester   4. Rh negative state in antepartum period, first trimester     Plan: Discharge home in stable condition. Initiate prenatal care. Recommend  viability [place] in 7-10 days with MFM at [facility]. Rhogam given in MAU.   Follow-up Information    [facility] Follow up.   Specialty: Radiology Why: Ultrasound will call you to schedule.  Return to MAU as needed for emergencies.  See list of prenatal providers. Contact information: [address] [place] [address] [phone]              Kristeen Miss, [place] student 1/26/022 7:02PM   CNM attestation:  I have seen and examined this patient; I agree with above documentation in the PA student's note.   Molly Rowe is a 25 y.o. G2P0101 reporting vaginal bleeding and positive pregnancy test.   PE: BP 100/69 (BP Location: Right Arm)   Pulse 69   Temp 98.4 F (36.9 C)   Resp 18   Ht 5\' 1"  (1.549 m)   Wt 52.2 kg   LMP 07/13/2020   SpO2 100%   BMI 21.73 kg/m  Gen: calm comfortable, NAD Resp: normal effort, no distress Abd: soft, nontender  ROS, labs, PMH reviewed   1. Abdominal pain during pregnancy, antepartum   2. Vaginal bleeding in pregnancy, first trimester   3. Normal IUP (intrauterine pregnancy) on prenatal ultrasound, first trimester   4. Rh negative state in antepartum period, first trimester      Plan: D/C home F/U viability 14/11/2019 in 7-10 days Return precautions reviewed   9-10, CNM  8:28 PM

## 2020-09-05 LAB — RH IG WORKUP (INCLUDES ABO/RH)
ABO/RH(D): O NEG
Antibody Screen: NEGATIVE
Gestational Age(Wks): 5
Unit division: 0

## 2020-09-05 LAB — ABO/RH: ABO/RH(D): O NEG

## 2020-09-05 LAB — GC/CHLAMYDIA PROBE AMP (~~LOC~~) NOT AT ARMC
Chlamydia: NEGATIVE
Comment: NEGATIVE
Comment: NORMAL
Neisseria Gonorrhea: NEGATIVE

## 2021-12-14 IMAGING — US US OB < 14 WEEKS - US OB TV
1 series · 15 of 28 positions shown · non-contrast
Comparison: None.

CLINICAL DATA: Right lower abdominal pain

EXAM:
OBSTETRIC <14 WK US AND TRANSVAGINAL OB US
TECHNIQUE: Both transabdominal and transvaginal ultrasound examinations were
performed for complete evaluation of the gestation as well as the
maternal uterus, adnexal regions, and pelvic cul-de-sac.
Transvaginal technique was performed to assess early pregnancy.

[Series 1: us ob < 14 weeks - us ob tv · 15 of 51 slices shown]
[im 1/51]
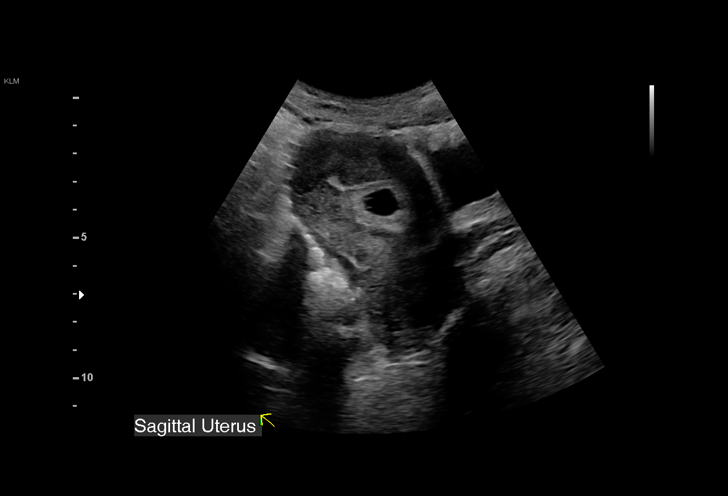
[im 4/51]
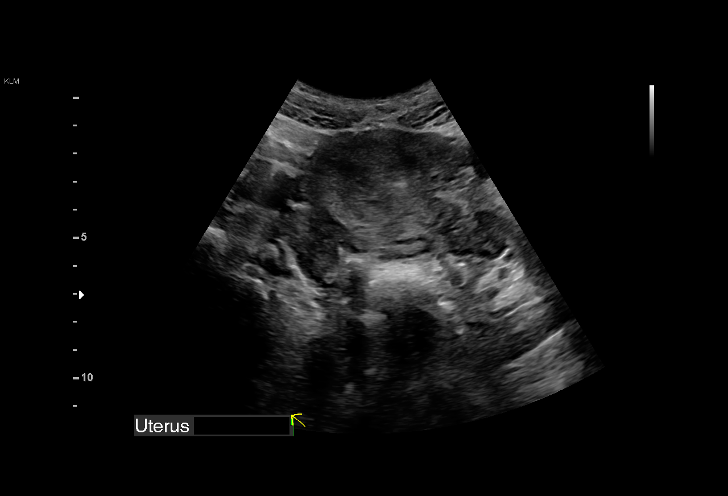
[im 8/51]
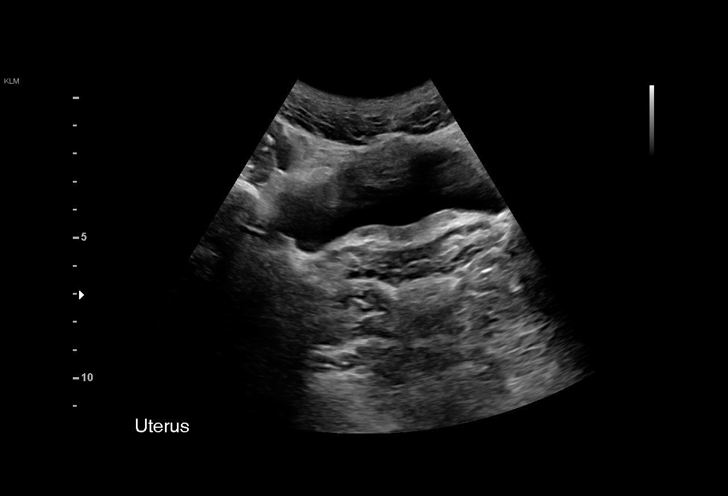
[im 12/51]
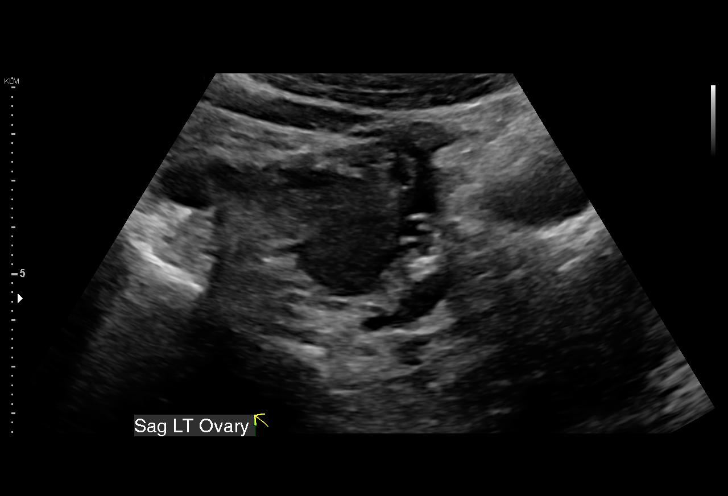
[im 15/51]
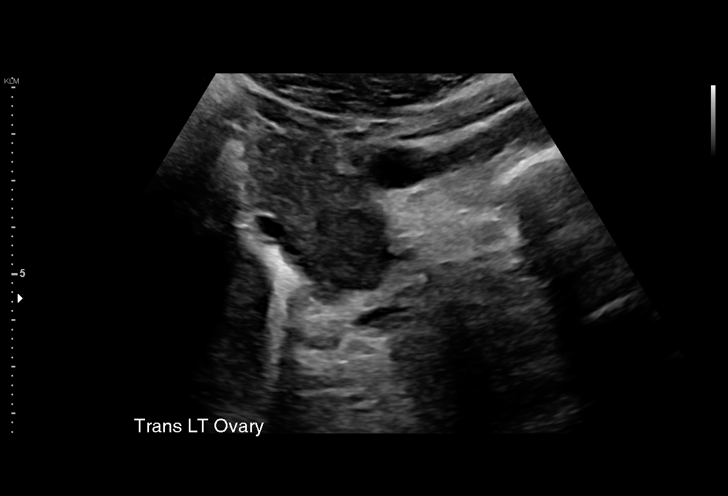
[im 19/51]
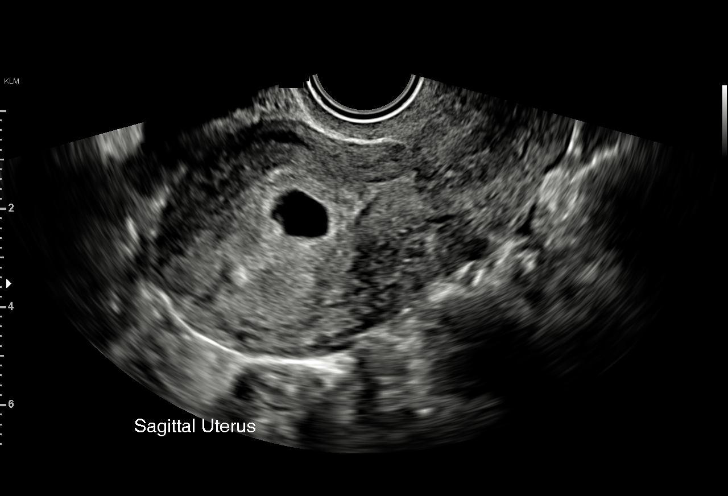
[im 23/51]
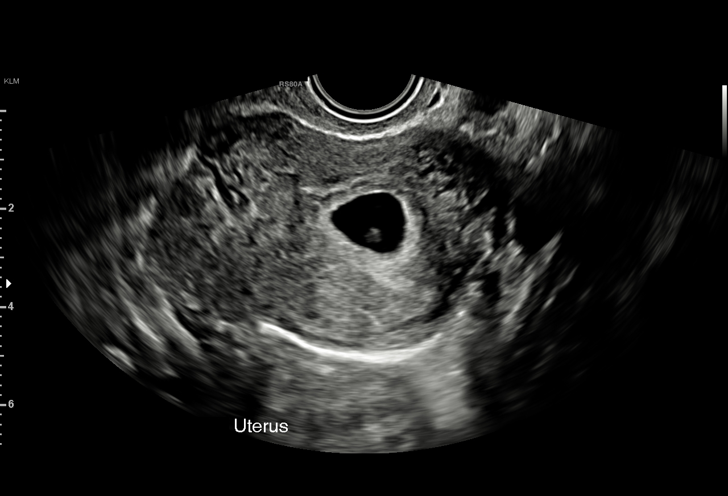
[im 26/51]
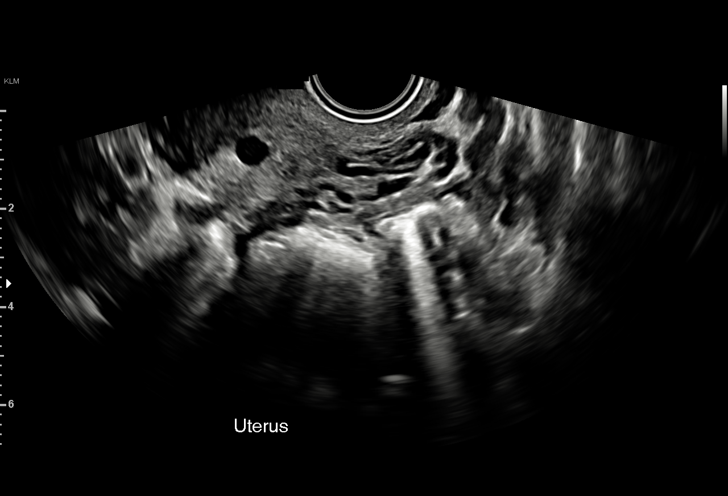
[im 28/51]
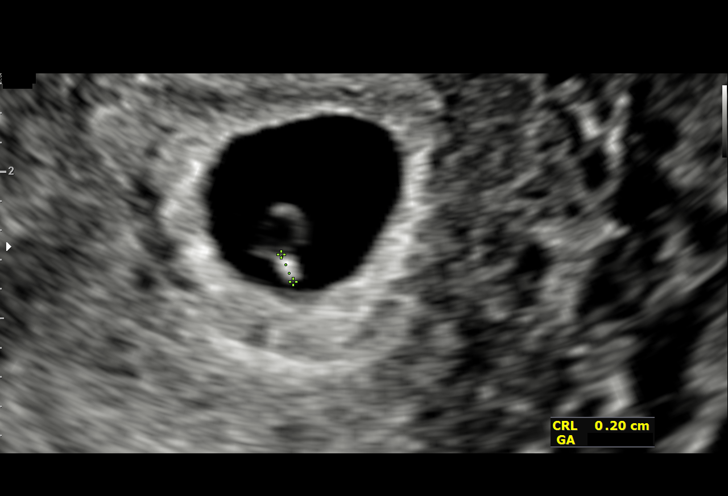
[im 32/51]
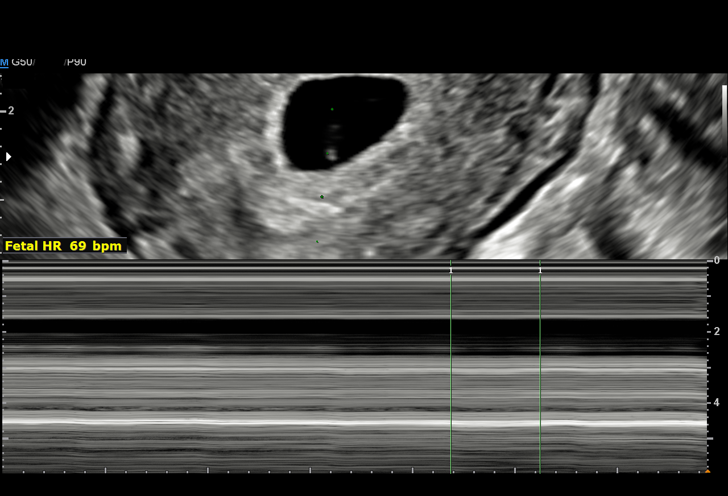
[im 36/51]
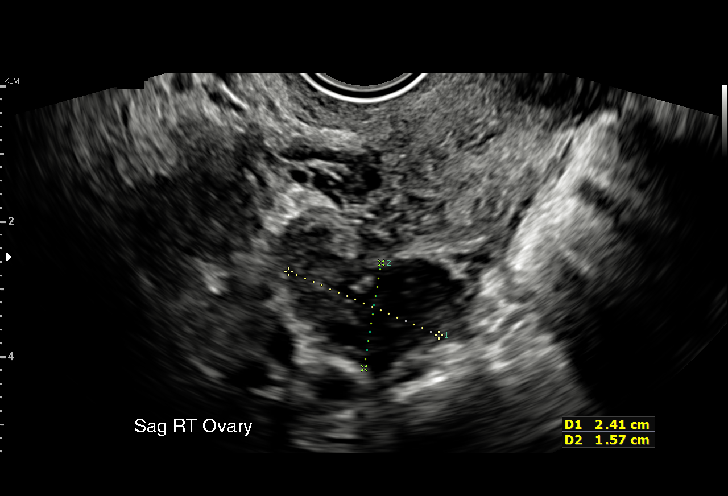
[im 39/51]
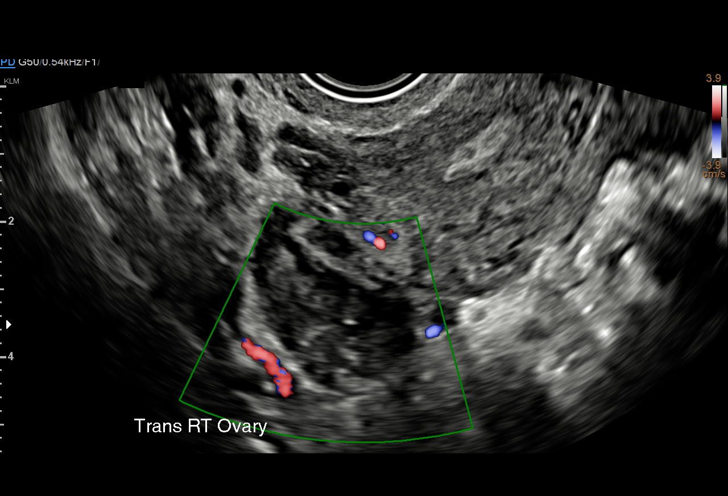
[im 43/51]
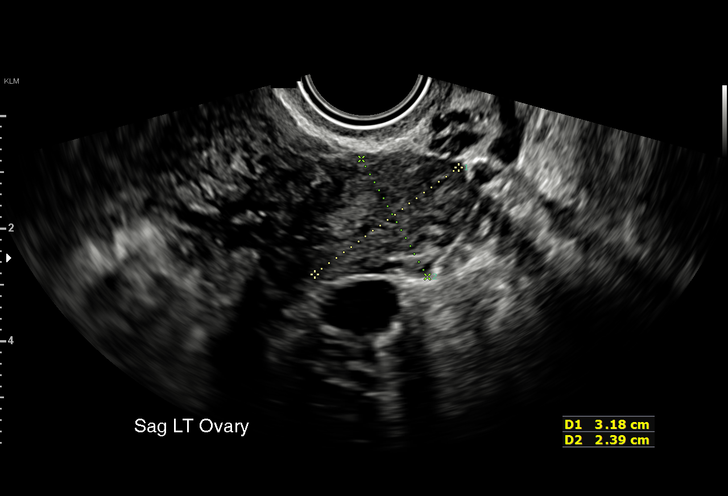
[im 47/51]
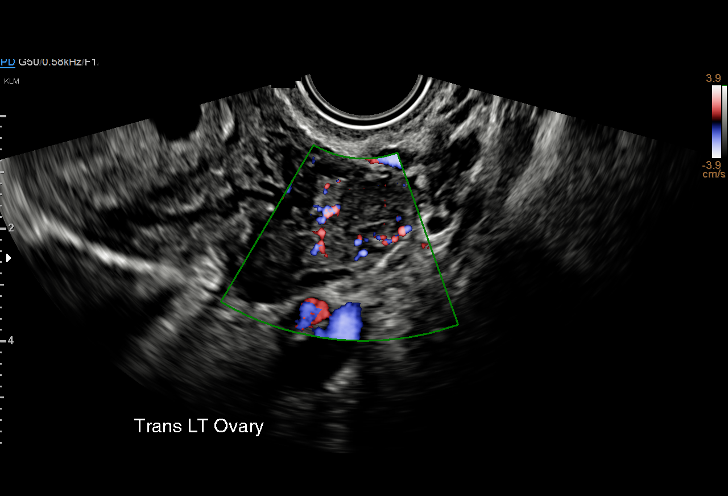
[im 51/51]
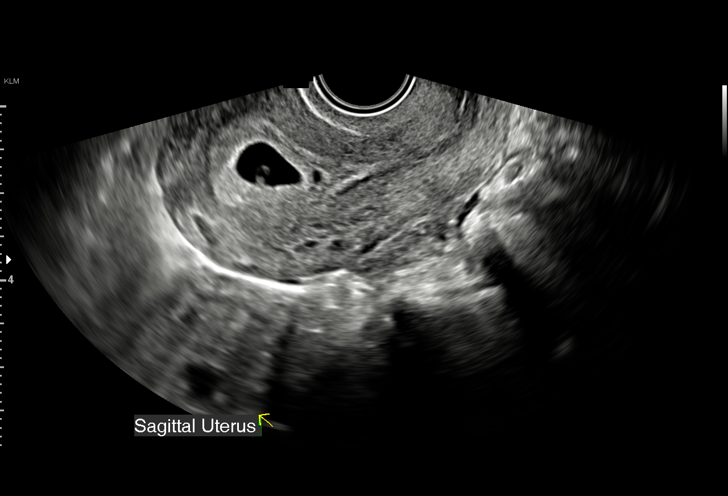

[15 of 28 positions shown; findings below may reference images not displayed]

FINDINGS: Intrauterine gestational sac: Single

Yolk sac:  Visualized.

Embryo:  Visualized.

Cardiac Activity: Visualized.

Heart Rate: 75 bpm

MSD:   mm    w     d

CRL:  1.9 mm too small to date US EDC:

Subchorionic hemorrhage:  None visualized.

Maternal uterus/adnexae: No adnexal mass or free fluid.
IMPRESSION: Early intrauterine pregnancy with 1.9 mm embryo. This is too small
to date. Fetal bradycardia of study 5 beats per minute, likely
related to early gestational age. This could be followed with repeat
ultrasound in 10-14 days to ensure expected progression.
